# Patient Record
Sex: Female | Born: 1950 | Smoking: Current some day smoker
Health system: Southern US, Community
[De-identification: ages and names within clinical notes are randomized; demographics above are authoritative.]

---

## 2019-02-22 DIAGNOSIS — U071 COVID-19: Secondary | ICD-10-CM

## 2019-02-22 HISTORY — DX: COVID-19: U07.1

## 2019-03-09 ENCOUNTER — Other Ambulatory Visit: Payer: Self-pay

## 2019-03-09 DIAGNOSIS — Z20822 Contact with and (suspected) exposure to covid-19: Secondary | ICD-10-CM

## 2019-03-10 LAB — NOVEL CORONAVIRUS, NAA: SARS-CoV-2, NAA: DETECTED — AB

## 2019-03-14 ENCOUNTER — Telehealth: Payer: Self-pay | Admitting: Critical Care Medicine

## 2019-03-14 NOTE — Telephone Encounter (Signed)
I connected this patient had a COVID test on September 16 it was positive.  The patient was already aware of this result.  The patient's had abdominal pain cough fatigue mild shortness of breath and weakness.  She has been keeping her fever very down with ibuprofen.  She was exposed to her sister and her sister's husband who are positive.  Her symptoms began on 16 September and she drove herself to be tested.  She knew she had been exposed as well.  She understands she will have to stay in isolation till 26 September.  this is provided her fever has resolved up 3 days prior to this  The patient does not have a primary care physician and I told her if her symptoms were to worsen she should go to the emergency room she took this under advisement.  She knows the health department may be in touch with her.

## 2020-03-01 ENCOUNTER — Other Ambulatory Visit: Payer: Self-pay

## 2020-03-01 ENCOUNTER — Encounter: Payer: Self-pay | Admitting: Physician Assistant

## 2020-03-01 ENCOUNTER — Ambulatory Visit (INDEPENDENT_AMBULATORY_CARE_PROVIDER_SITE_OTHER): Payer: Medicare Other | Admitting: Physician Assistant

## 2020-03-01 VITALS — BP 126/74 | HR 85 | Ht 64.17 in | Wt 163.6 lb

## 2020-03-01 DIAGNOSIS — Z8616 Personal history of COVID-19: Secondary | ICD-10-CM

## 2020-03-01 DIAGNOSIS — R030 Elevated blood-pressure reading, without diagnosis of hypertension: Secondary | ICD-10-CM | POA: Diagnosis not present

## 2020-03-01 DIAGNOSIS — N393 Stress incontinence (female) (male): Secondary | ICD-10-CM

## 2020-03-01 DIAGNOSIS — Z7689 Persons encountering health services in other specified circumstances: Secondary | ICD-10-CM | POA: Diagnosis not present

## 2020-03-01 DIAGNOSIS — R002 Palpitations: Secondary | ICD-10-CM | POA: Diagnosis not present

## 2020-03-01 NOTE — Progress Notes (Signed)
New Patient Office Visit  Subjective:  Patient ID: Kerry Anderson, female    DOB: 02/17/1951  Age: 69 y.o. MRN: [id number]  CC:  Chief Complaint  Patient presents with  . New Patient (Initial Visit)    HPI ZULAY CORRIE presents to establish care. Pt reports she had Covid-19 infection September of 2020 and since then has been experiencing intermittent heart palpitations- fluttering sensation. Denies chest pain, shortness of breath, edema, dizziness, or lightheadedness. She hasn't been to the doctor in a long time. Denies prior history of anxiety, heart disease, hypertension, diabetes, hyperlipidemia or thyroid disorder. She doesn't take prescribed medications and takes aspirin or ibuprofen as needed for pain relief. Also has complaints of urinary leakage when coughing or exerting pressure. Family history is pertinent for hypertension and diabetes.  Past Medical History:  Diagnosis Date  . COVID-19 02/2019    History reviewed. No pertinent surgical history.  History reviewed. No pertinent family history.  Social History   Socioeconomic History  . Marital status: Unknown    Spouse name: Not on file  . Number of children: Not on file  . Years of education: Not on file  . Highest education level: Not on file  Occupational History  . Not on file  Tobacco Use  . Smoking status: Never Smoker  . Smokeless tobacco: Never Used  Substance and Sexual Activity  . Alcohol use: Yes  . Drug use: Not Currently  . Sexual activity: Not Currently  Other Topics Concern  . Not on file  Social History Narrative  . Not on file   Social Determinants of Health   Financial Resource Strain:   . Difficulty of Paying Living Expenses: Not on file  Food Insecurity:   . Worried About Programme researcher, broadcasting/film/video in the Last Year: Not on file  . Ran Out of Food in the Last Year: Not on file  Transportation Needs:   . Lack of Transportation (Medical): Not on file  . Lack of Transportation (Non-Medical):  Not on file  Physical Activity:   . Days of Exercise per Week: Not on file  . Minutes of Exercise per Session: Not on file  Stress:   . Feeling of Stress : Not on file  Social Connections:   . Frequency of Communication with Friends and Family: Not on file  . Frequency of Social Gatherings with Friends and Family: Not on file  . Attends Religious Services: Not on file  . Active Member of Clubs or Organizations: Not on file  . Attends Banker Meetings: Not on file  . Marital Status: Not on file  Intimate Partner Violence:   . Fear of Current or Ex-Partner: Not on file  . Emotionally Abused: Not on file  . Physically Abused: Not on file  . Sexually Abused: Not on file    ROS Review of Systems  Objective:   Today's Vitals: BP 126/74   Pulse 85   Ht 5' 4.17" (1.63 m)   Wt 163 lb 9.6 oz (74.2 kg)   SpO2 96%   BMI 27.93 kg/m   Physical Exam General:  Well Developed, well nourished, appropriate for stated age.  Neuro:  Alert and oriented,  extra-ocular muscles intact  HEENT:  Normocephalic, atraumatic, neck supple, no carotid bruits appreciated  Skin:  no gross rash, warm, pink. Cardiac:  RRR, S1 S2 Respiratory:  ECTA B/L and A/P, Not using accessory muscles, speaking in full sentences- unlabored. Vascular:  Ext warm, no cyanosis apprec.;  cap RF less 2 sec. Psych:  No HI/SI, judgement and insight good, Euthymic mood. Full Affect.  Assessment & Plan:   Problem List Items Addressed This Visit    None    Visit Diagnoses    Encounter to establish care    -  Primary   History of COVID-19       Palpitations       Stress incontinence       Elevated blood pressure reading in office without diagnosis of hypertension         History of Covid-19, Palpitations: -Palpitations could be related to post-covid 19. -Currently stable and asymptomatic, and denies other cardiac symptoms so plan to perform EKG with welcome to [facility].  Elevated BP reading in office without  diagnosis of hypertension: -BP initially elevated, BP recheck significantly improved and stable. -Recommend ambulatory BP and pulse monitoring.  -Follow a low sodium diet. -Will continue to monitor.  Stress incontinence: -Recommend pelvic floor exercises and reduce diuretics such as caffeine.  -If symptoms worsen or fail to improve recommend referral to Urology.  No outpatient encounter medications on file as of 03/01/2020.   No facility-administered encounter medications on file as of 03/01/2020.    Follow-up: Return in about 6 weeks (around 04/12/2020) for Welcome to [facility] and [facility] few days prior.   Mayer Masker, PA-C

## 2020-03-01 NOTE — Patient Instructions (Addendum)
Preventing Hypertension Hypertension, commonly called high blood pressure, is when the force of blood pumping through the arteries is too strong. Arteries are blood vessels that carry blood from the heart throughout the body. Over time, hypertension can damage the arteries and decrease blood flow to important parts of the body, including the brain, heart, and kidneys. Often, hypertension does not cause symptoms until blood pressure is very high. For this reason, it is important to have your blood pressure checked on a regular basis. Hypertension can often be prevented with diet and lifestyle changes. If you already have hypertension, you can control it with diet and lifestyle changes, as well as medicine. What nutrition changes can be made? Maintain a healthy diet. This includes:  Eating less salt (sodium). Ask your health care provider how much sodium is safe for you to have. The general recommendation is to consume less than 1 tsp (2,300 mg) of sodium a day. ? Do not add salt to your food. ? Choose low-sodium options when grocery shopping and eating out.  Limiting fats in your diet. You can do this by eating low-fat or fat-free dairy products and by eating less red meat.  Eating more fruits, vegetables, and whole grains. Make a goal to eat: ? 1-2 cups of fresh fruits and vegetables each day. ? 3-4 servings of whole grains each day.  Avoiding foods and beverages that have added sugars.  Eating fish that contain healthy fats (omega-3 fatty acids), such as mackerel or salmon. If you need help putting together a healthy eating plan, try the DASH diet. This diet is high in fruits, vegetables, and whole grains. It is low in sodium, red meat, and added sugars. DASH stands for Dietary Approaches to Stop Hypertension. What lifestyle changes can be made?   Lose weight if you are overweight. Losing just 3?5% of your body weight can help prevent or control hypertension. ? For example, if your present  weight is 200 lb (91 kg), a loss of 3-5% of your weight means losing 6-10 lb (2.7-4.5 kg). ? Ask your health care provider to help you with a diet and exercise plan to safely lose weight.  Get enough exercise. Do at least 150 minutes of moderate-intensity exercise each week. ? You could do this in short exercise sessions several times a day, or you could do longer exercise sessions a few times a week. For example, you could take a brisk 10-minute walk or bike ride, 3 times a day, for 5 days a week.  Find ways to reduce stress, such as exercising, meditating, listening to music, or taking a yoga class. If you need help reducing stress, ask your health care provider.  Do not smoke. This includes e-cigarettes. Chemicals in tobacco and nicotine products raise your blood pressure each time you smoke. If you need help quitting, ask your health care provider.  Avoid alcohol. If you drink alcohol, limit alcohol intake to no more than 1 drink a day for nonpregnant women and 2 drinks a day for men. One drink equals 12 oz of beer, 5 oz of wine, or 1 oz of hard liquor. Why are these changes important? Diet and lifestyle changes can help you prevent hypertension, and they may make you feel better overall and improve your quality of life. If you have hypertension, making these changes will help you control it and help prevent major complications, such as:  Hardening and narrowing of arteries that supply blood to: ? Your heart. This can cause a heart  attack. ? Your brain. This can cause a stroke. ? Your kidneys. This can cause kidney failure.  Stress on your heart muscle, which can cause heart failure. What can I do to lower my risk?  Work with your health care provider to make a hypertension prevention plan that works for you. Follow your plan and keep all follow-up visits as told by your health care provider.  Learn how to check your blood pressure at home. Make sure that you know your personal target  blood pressure, as told by your health care provider. How is this treated? In addition to diet and lifestyle changes, your health care provider may recommend medicines to help lower your blood pressure. You may need to try a few different medicines to find what works best for you. You also may need to take more than one medicine. Take over-the-counter and prescription medicines only as told by your health care provider. Where to find support Your health care provider can help you prevent hypertension and help you keep your blood pressure at a healthy level. Your local hospital or your community may also provide support services and prevention programs. The American Heart Association offers an online support network at: https://www.lee.net/ Where to find more information Learn more about hypertension from:  National Heart, Lung, and Blood Institute: https://www.peterson.org/  Centers for Disease Control and Prevention: AboutHD.co.nz  American Academy of Family Physicians: http://familydoctor.org/familydoctor/en/diseases-conditions/high-blood-pressure.printerview.all.html Learn more about the DASH diet from:  National Heart, Lung, and Blood Institute: WedMap.it Contact a health care provider if:  You think you are having a reaction to medicines you have taken.  You have recurrent headaches or feel dizzy.  You have swelling in your ankles.  You have trouble with your vision. Summary  Hypertension often does not cause any symptoms until blood pressure is very high. It is important to get your blood pressure checked regularly.  Diet and lifestyle changes are the most important steps in preventing hypertension.  By keeping your blood pressure in a healthy range, you can prevent complications like heart attack, heart failure, stroke, and kidney failure.  Work with your health care  provider to make a hypertension prevention plan that works for you. This information is not intended to replace advice given to you by your health care provider. Make sure you discuss any questions you have with your health care provider. Document Revised: 10/01/2018 Document Reviewed: 02/18/2016 Elsevier Patient Education  2020 [employer].  Palpitations Palpitations are feelings that your heartbeat is irregular or is faster than normal. It may feel like your heart is fluttering or skipping a beat. Palpitations are usually not a serious problem. They may be caused by many things, including smoking, caffeine, alcohol, stress, and certain medicines or drugs. Most causes of palpitations are not serious. However, some palpitations can be a sign of a serious problem. You may need further tests to rule out serious medical problems. Follow these instructions at home:     Pay attention to any changes in your condition. Take these actions to help manage your symptoms: Eating and drinking  Avoid foods and drinks that may cause palpitations. These may include: ? Caffeinated coffee, tea, soft drinks, diet pills, and energy drinks. ? Chocolate. ? Alcohol. Lifestyle  Take steps to reduce your stress and anxiety. Things that can help you relax include: ? Yoga. ? Mind-body activities, such as deep breathing, meditation, or using words and images to create positive thoughts (guided imagery). ? Physical activity, such as swimming, jogging, or walking.  Tell your health care provider if your palpitations increase with activity. If you have chest pain or shortness of breath with activity, do not continue the activity until you are seen by your health care provider. ? Biofeedback. This is a method that helps you learn to use your mind to control things in your body, such as your heartbeat.  Do not use drugs, including cocaine or ecstasy. Do not use marijuana.  Get plenty of rest and sleep. Keep a regular  bed time. General instructions  Take over-the-counter and prescription medicines only as told by your health care provider.  Do not use any products that contain nicotine or tobacco, such as cigarettes and e-cigarettes. If you need help quitting, ask your health care provider.  Keep all follow-up visits as told by your health care provider. This is important. These may include visits for further testing if palpitations do not go away or get worse. Contact a health care provider if you:  Continue to have a fast or irregular heartbeat after 24 hours.  Notice that your palpitations occur more often. Get help right away if you:  Have chest pain or shortness of breath.  Have a severe headache.  Feel dizzy or you faint. Summary  Palpitations are feelings that your heartbeat is irregular or is faster than normal. It may feel like your heart is fluttering or skipping a beat.  Palpitations may be caused by many things, including smoking, caffeine, alcohol, stress, certain medicines, and drugs.  Although most causes of palpitations are not serious, some causes can be a sign of a serious medical problem.  Get help right away if you faint or have chest pain, shortness of breath, a severe headache, or dizziness. This information is not intended to replace advice given to you by your health care provider. Make sure you discuss any questions you have with your health care provider. Document Revised: 07/22/2017 Document Reviewed: 07/22/2017 Elsevier Patient Education  2020 Elsevier Inc.   COVID-19 COVID-19 is a respiratory infection that is caused by a virus called severe acute respiratory syndrome coronavirus 2 (SARS-CoV-2). The disease is also known as coronavirus disease or novel coronavirus. In some people, the virus may not cause any symptoms. In others, it may cause a serious infection. The infection can get worse quickly and can lead to complications, such as:  Pneumonia, or infection of  the lungs.  Acute respiratory distress syndrome or ARDS. This is a condition in which fluid build-up in the lungs prevents the lungs from filling with air and passing oxygen into the blood.  Acute respiratory failure. This is a condition in which there is not enough oxygen passing from the lungs to the body or when carbon dioxide is not passing from the lungs out of the body.  Sepsis or septic shock. This is a serious bodily reaction to an infection.  Blood clotting problems.  Secondary infections due to bacteria or fungus.  Organ failure. This is when your body's organs stop working. The virus that causes COVID-19 is contagious. This means that it can spread from person to person through droplets from coughs and sneezes (respiratory secretions). What are the causes? This illness is caused by a virus. You may catch the virus by:  Breathing in droplets from an infected person. Droplets can be spread by a person breathing, speaking, singing, coughing, or sneezing.  Touching something, like a table or a doorknob, that was exposed to the virus (contaminated) and then touching your mouth, nose, or eyes.  What increases the risk? Risk for infection You are more likely to be infected with this virus if you:  Are within 6 feet (2 meters) of a person with COVID-19.  Provide care for or live with a person who is infected with COVID-19.  Spend time in crowded indoor spaces or live in shared housing. Risk for serious illness You are more likely to become seriously ill from the virus if you:  Are 57 years of age or older. The higher your age, the more you are at risk for serious illness.  Live in a nursing home or long-term care facility.  Have cancer.  Have a long-term (chronic) disease such as: ? Chronic lung disease, including chronic obstructive pulmonary disease or asthma. ? A long-term disease that lowers your body's ability to fight infection (immunocompromised). ? Heart disease,  including heart failure, a condition in which the arteries that lead to the heart become narrow or blocked (coronary artery disease), a disease which makes the heart muscle thick, weak, or stiff (cardiomyopathy). ? Diabetes. ? Chronic kidney disease. ? Sickle cell disease, a condition in which red blood cells have an abnormal "sickle" shape. ? Liver disease.  Are obese. What are the signs or symptoms? Symptoms of this condition can range from mild to severe. Symptoms may appear any time from 2 to 14 days after being exposed to the virus. They include:  A fever or chills.  A cough.  Difficulty breathing.  Headaches, body aches, or muscle aches.  Runny or stuffy (congested) nose.  A sore throat.  New loss of taste or smell. Some people may also have stomach problems, such as nausea, vomiting, or diarrhea. Other people may not have any symptoms of COVID-19. How is this diagnosed? This condition may be diagnosed based on:  Your signs and symptoms, especially if: ? You live in an area with a COVID-19 outbreak. ? You recently traveled to or from an area where the virus is common. ? You provide care for or live with a person who was diagnosed with COVID-19. ? You were exposed to a person who was diagnosed with COVID-19.  A physical exam.  Lab tests, which may include: ? Taking a sample of fluid from the back of your nose and throat (nasopharyngeal fluid), your nose, or your throat using a swab. ? A sample of mucus from your lungs (sputum). ? Blood tests.  Imaging tests, which may include, X-rays, CT scan, or ultrasound. How is this treated? At present, there is no medicine to treat COVID-19. Medicines that treat other diseases are being used on a trial basis to see if they are effective against COVID-19. Your health care provider will talk with you about ways to treat your symptoms. For most people, the infection is mild and can be managed at home with rest, fluids, and  over-the-counter medicines. Treatment for a serious infection usually takes places in a hospital intensive care unit (ICU). It may include one or more of the following treatments. These treatments are given until your symptoms improve.  Receiving fluids and medicines through an IV.  Supplemental oxygen. Extra oxygen is given through a tube in the nose, a face mask, or a hood.  Positioning you to lie on your stomach (prone position). This makes it easier for oxygen to get into the lungs.  Continuous positive airway pressure (CPAP) or bi-level positive airway pressure (BPAP) machine. This treatment uses mild air pressure to keep the airways open. A tube that is connected to  a motor delivers oxygen to the body.  Ventilator. This treatment moves air into and out of the lungs by using a tube that is placed in your windpipe.  Tracheostomy. This is a procedure to create a hole in the neck so that a breathing tube can be inserted.  Extracorporeal membrane oxygenation (ECMO). This procedure gives the lungs a chance to recover by taking over the functions of the heart and lungs. It supplies oxygen to the body and removes carbon dioxide. Follow these instructions at home: Lifestyle  If you are sick, stay home except to get medical care. Your health care provider will tell you how long to stay home. Call your health care provider before you go for medical care.  Rest at home as told by your health care provider.  Do not use any products that contain nicotine or tobacco, such as cigarettes, e-cigarettes, and chewing tobacco. If you need help quitting, ask your health care provider.  Return to your normal activities as told by your health care provider. Ask your health care provider what activities are safe for you. General instructions  Take over-the-counter and prescription medicines only as told by your health care provider.  Drink enough fluid to keep your urine pale yellow.  Keep all follow-up  visits as told by your health care provider. This is important. How is this prevented?  There is no vaccine to help prevent COVID-19 infection. However, there are steps you can take to protect yourself and others from this virus. To protect yourself:   Do not travel to areas where COVID-19 is a risk. The areas where COVID-19 is reported change often. To identify high-risk areas and travel restrictions, check the CDC travel website: StageSync.si  If you live in, or must travel to, an area where COVID-19 is a risk, take precautions to avoid infection. ? Stay away from people who are sick. ? Wash your hands often with soap and water for 20 seconds. If soap and water are not available, use an alcohol-based hand sanitizer. ? Avoid touching your mouth, face, eyes, or nose. ? Avoid going out in public, follow guidance from your state and local health authorities. ? If you must go out in public, wear a cloth face covering or face mask. Make sure your mask covers your nose and mouth. ? Avoid crowded indoor spaces. Stay at least 6 feet (2 meters) away from others. ? Disinfect objects and surfaces that are frequently touched every day. This may include:  Counters and tables.  Doorknobs and light switches.  Sinks and faucets.  Electronics, such as phones, remote controls, keyboards, computers, and tablets. To protect others: If you have symptoms of COVID-19, take steps to prevent the virus from spreading to others.  If you think you have a COVID-19 infection, contact your health care provider right away. Tell your health care team that you think you may have a COVID-19 infection.  Stay home. Leave your house only to seek medical care. Do not use public transport.  Do not travel while you are sick.  Wash your hands often with soap and water for 20 seconds. If soap and water are not available, use alcohol-based hand sanitizer.  Stay away from other members of your household. Let  healthy household members care for children and pets, if possible. If you have to care for children or pets, wash your hands often and wear a mask. If possible, stay in your own room, separate from others. Use a different bathroom.  Make  sure that all people in your household wash their hands well and often.  Cough or sneeze into a tissue or your sleeve or elbow. Do not cough or sneeze into your hand or into the air.  Wear a cloth face covering or face mask. Make sure your mask covers your nose and mouth. Where to find more information  Centers for Disease Control and Prevention: StickerEmporium.tnwww.cdc.gov/coronavirus/2019-ncov/index.html  World Health Organization: https://thompson-craig.com/www.who.int/health-topics/coronavirus Contact a health care provider if:  You live in or have traveled to an area where COVID-19 is a risk and you have symptoms of the infection.  You have had contact with someone who has COVID-19 and you have symptoms of the infection. Get help right away if:  You have trouble breathing.  You have pain or pressure in your chest.  You have confusion.  You have bluish lips and fingernails.  You have difficulty waking from sleep.  You have symptoms that get worse. These symptoms may represent a serious problem that is an emergency. Do not wait to see if the symptoms will go away. Get medical help right away. Call your local emergency services (911 in the [place] Do not drive yourself to the hospital. Let the emergency medical personnel know if you think you have COVID-19. Summary  COVID-19 is a respiratory infection that is caused by a virus. It is also known as coronavirus disease or novel coronavirus. It can cause serious infections, such as pneumonia, acute respiratory distress syndrome, acute respiratory failure, or sepsis.  The virus that causes COVID-19 is contagious. This means that it can spread from person to person through droplets from breathing, speaking, singing, coughing, or  sneezing.  You are more likely to develop a serious illness if you are 850 years of age or older, have a weak immune system, live in a nursing home, or have chronic disease.  There is no medicine to treat COVID-19. Your health care provider will talk with you about ways to treat your symptoms.  Take steps to protect yourself and others from infection. Wash your hands often and disinfect objects and surfaces that are frequently touched every day. Stay away from people who are sick and wear a mask if you are sick. This information is not intended to replace advice given to you by your health care provider. Make sure you discuss any questions you have with your health care provider. Document Revised: 04/08/2019 Document Reviewed: 07/15/2018 Elsevier Patient Education  2020 [employer]

## 2020-04-20 ENCOUNTER — Other Ambulatory Visit: Payer: Self-pay | Admitting: Physician Assistant

## 2020-04-20 DIAGNOSIS — Z0000 Encounter for general adult medical examination without abnormal findings: Secondary | ICD-10-CM

## 2020-04-23 ENCOUNTER — Other Ambulatory Visit: Payer: Self-pay

## 2020-04-23 ENCOUNTER — Other Ambulatory Visit: Payer: Medicare Other

## 2020-04-23 DIAGNOSIS — Z0000 Encounter for general adult medical examination without abnormal findings: Secondary | ICD-10-CM | POA: Diagnosis not present

## 2020-04-23 NOTE — Addendum Note (Signed)
Addended by: Sylvester Harder on: 04/23/2020 09:06 AM   Modules accepted: Orders

## 2020-04-23 NOTE — Addendum Note (Signed)
Addended by: Sylvester Harder on: 04/23/2020 09:11 AM   Modules accepted: Orders

## 2020-04-24 LAB — COMPREHENSIVE METABOLIC PANEL
ALT: 19 IU/L (ref 0–32)
AST: 21 IU/L (ref 0–40)
Albumin/Globulin Ratio: 1.4 (ref 1.2–2.2)
Albumin: 4.1 g/dL (ref 3.8–4.8)
Alkaline Phosphatase: 103 IU/L (ref 44–121)
BUN/Creatinine Ratio: 24 (ref 12–28)
BUN: 21 mg/dL (ref 8–27)
Bilirubin Total: <0.2 mg/dL (ref 0.0–1.2)
CO2: 23 mmol/L (ref 20–29)
Calcium: 9.5 mg/dL (ref 8.7–10.3)
Chloride: 105 mmol/L (ref 96–106)
Creatinine, Ser: 0.87 mg/dL (ref 0.57–1.00)
GFR calc Af Amer: 78 mL/min/{1.73_m2} (ref >59)
GFR calc non Af Amer: 68 mL/min/{1.73_m2} (ref >59)
Globulin, Total: 2.9 g/dL (ref 1.5–4.5)
Glucose: 106 mg/dL — ABNORMAL HIGH (ref 65–99)
Potassium: 4.3 mmol/L (ref 3.5–5.2)
Sodium: 141 mmol/L (ref 134–144)
Total Protein: 7 g/dL (ref 6.0–8.5)

## 2020-04-24 LAB — CBC
Hematocrit: 47.1 % — ABNORMAL HIGH (ref 34.0–46.6)
Hemoglobin: 15.7 g/dL (ref 11.1–15.9)
MCH: 28.8 pg (ref 26.6–33.0)
MCHC: 33.3 g/dL (ref 31.5–35.7)
MCV: 86 fL (ref 79–97)
Platelets: 306 10*3/uL (ref 150–450)
RBC: 5.46 x10E6/uL — ABNORMAL HIGH (ref 3.77–5.28)
RDW: 13.4 % (ref 11.7–15.4)
WBC: 8.1 10*3/uL (ref 3.4–10.8)

## 2020-04-24 LAB — LIPID PANEL
Chol/HDL Ratio: 4.1 ratio (ref 0.0–4.4)
Cholesterol, Total: 235 mg/dL — ABNORMAL HIGH (ref 100–199)
HDL: 57 mg/dL (ref >39)
LDL Chol Calc (NIH): 164 mg/dL — ABNORMAL HIGH (ref 0–99)
Triglycerides: 79 mg/dL (ref 0–149)
VLDL Cholesterol Cal: 14 mg/dL (ref 5–40)

## 2020-04-24 LAB — LYME AB/WESTERN BLOT REFLEX
LYME DISEASE AB, QUANT, IGM: <0.8 index (ref 0.00–0.79)
Lyme IgG/IgM Ab: <0.91 {ISR} (ref 0.00–0.90)

## 2020-04-24 LAB — HEMOGLOBIN A1C
Est. average glucose Bld gHb Est-mCnc: 117 mg/dL
Hgb A1c MFr Bld: 5.7 % — ABNORMAL HIGH (ref 4.8–5.6)

## 2020-04-24 LAB — TSH: TSH: 2.27 u[IU]/mL (ref 0.450–4.500)

## 2020-04-26 ENCOUNTER — Other Ambulatory Visit: Payer: Self-pay

## 2020-04-26 ENCOUNTER — Ambulatory Visit (INDEPENDENT_AMBULATORY_CARE_PROVIDER_SITE_OTHER): Payer: Medicare Other | Admitting: Physician Assistant

## 2020-04-26 VITALS — Ht 64.0 in | Wt 162.0 lb

## 2020-04-26 DIAGNOSIS — Z1231 Encounter for screening mammogram for malignant neoplasm of breast: Secondary | ICD-10-CM | POA: Diagnosis not present

## 2020-04-26 DIAGNOSIS — E785 Hyperlipidemia, unspecified: Secondary | ICD-10-CM

## 2020-04-26 DIAGNOSIS — Z1211 Encounter for screening for malignant neoplasm of colon: Secondary | ICD-10-CM

## 2020-04-26 DIAGNOSIS — Z1159 Encounter for screening for other viral diseases: Secondary | ICD-10-CM | POA: Diagnosis not present

## 2020-04-26 DIAGNOSIS — Z0000 Encounter for general adult medical examination without abnormal findings: Secondary | ICD-10-CM

## 2020-04-26 DIAGNOSIS — Z780 Asymptomatic menopausal state: Secondary | ICD-10-CM

## 2020-04-26 DIAGNOSIS — R7303 Prediabetes: Secondary | ICD-10-CM

## 2020-04-26 NOTE — Progress Notes (Signed)
Virtual Visit via Telephone Note:  I connected with Kerry Anderson by telephone and verified that I am speaking with the correct person using two identifiers.    I discussed the limitations, risks, security and privacy concerns for performing an evaluation and management service by telephone and the availability of in person appointments. The staff discussed with patient that there may be a patient responsible charge related to this service. The patient expressed understanding and agreed to proceed.   Location of Patient- Home Location of Provider- Office    Subjective:   Kerry Anderson is a 69 y.o. female who presents for Medicare Annual (Subsequent) preventive examination.  Review of Systems    General:   No F/C, wt loss Pulm:   No DIB, SOB, pleuritic chest pain Card:  No CP, palpitations Abd:  No n/v/d or pain Ext:  No inc edema from baseline   Objective:    Today's Vitals   04/26/20 1150  Weight: 162 lb (73.5 kg)  Height: 5\' 4"  (1.626 m)   Body mass index is 27.81 kg/m.  No flowsheet data found.  Current Medications (verified) No outpatient encounter medications on file as of 04/26/2020.   No facility-administered encounter medications on file as of 04/26/2020.    Allergies (verified) Patient has no allergy information on record.   History: Past Medical History:  Diagnosis Date   COVID-19 02/2019   No past surgical history on file. No family history on file. Social History   Socioeconomic History   Marital status: Unknown    Spouse name: Not on file   Number of children: Not on file   Years of education: Not on file   Highest education level: Not on file  Occupational History   Not on file  Tobacco Use   Smoking status: Never Smoker   Smokeless tobacco: Never Used  Substance and Sexual Activity   Alcohol use: Yes   Drug use: Not Currently   Sexual activity: Not Currently  Other Topics Concern   Not on file  Social History Narrative    Not on file   Social Determinants of Health   Financial Resource Strain:    Difficulty of Paying Living Expenses: Not on file  Food Insecurity:    Worried About Running Out of Food in the Last Year: Not on file   Ran Out of Food in the Last Year: Not on file  Transportation Needs:    Lack of Transportation (Medical): Not on file   Lack of Transportation (Non-Medical): Not on file  Physical Activity:    Days of Exercise per Week: Not on file   Minutes of Exercise per Session: Not on file  Stress:    Feeling of Stress : Not on file  Social Connections:    Frequency of Communication with Friends and Family: Not on file   Frequency of Social Gatherings with Friends and Family: Not on file   Attends Religious Services: Not on file   Active Member of Clubs or Organizations: Not on file   Attends 03/2019 Meetings: Not on file   Marital Status: Not on file    Tobacco Counseling Counseling given: Not Answered    Diabetic? No    Activities of Daily Living In your present state of health, do you have any difficulty performing the following activities: 04/26/2020  Hearing? N  Vision? Y  Difficulty concentrating or making decisions? N  Walking or climbing stairs? N  Dressing or bathing? N  Doing errands,  shopping? N  Some recent data might be hidden    Patient Care Team: Mayer Masker, PA-C as PCP - General (Physician Assistant)  Indicate any recent Medical Services you may have received from other than [facility] providers in the past year (date may be approximate).     Assessment:   This is a routine wellness examination for Kerry Anderson.  Hearing/Vision screen No exam data present  Dietary issues and exercise activities discussed: -Follow a heart healthy diet and reduce saturated and trans fats. Stay as active as possible.   Goals   None    Depression Screen PHQ 2/9 Scores 04/26/2020  PHQ - 2 Score 2  PHQ- 9 Score 7    Fall Risk Fall Risk   04/26/2020  Falls in the past year? 0  Risk for fall due to : No Fall Risks  Follow up Falls evaluation completed    Any stairs in or around the home? Yes  If so, are there any without handrails? Yes  Home free of loose throw rugs in walkways, pet beds, electrical cords, etc? No  Adequate lighting in your home to reduce risk of falls? Yes   ASSISTIVE DEVICES UTILIZED TO PREVENT FALLS:  Life alert? No  Use of a cane, walker or w/c? No  Grab bars in the bathroom? No  Shower chair or bench in shower? No  Elevated toilet seat or a handicapped toilet? No   TIMED UP AND GO:  Was the test performed? No . Telehealth visit   Cognitive Function: wnl     6CIT Screen 04/26/2020  What Year? 0 points  What month? 0 points  What time? 0 points  Count back from 20 0 points  Months in reverse 0 points  Repeat phrase 0 points  Total Score 0    Immunizations Immunization History  Administered Date(s) Administered   Influenza-Unspecified 04/25/2020   PFIZER SARS-COV-2 Vaccination 08/16/2019, 09/06/2019   Pneumococcal Polysaccharide-23 01/26/2019    TDAP status: Due, Education has been provided regarding the importance of this vaccine. Advised may receive this vaccine at local pharmacy or Health Dept. Aware to provide a copy of the vaccination record if obtained from local pharmacy or Health Dept. Verbalized acceptance and understanding. Flu Vaccine status: Up to date Pneumococcal vaccine status: Declined,  Education has been provided regarding the importance of this vaccine but patient still declined. Advised may receive this vaccine at local pharmacy or Health Dept. Aware to provide a copy of the vaccination record if obtained from local pharmacy or Health Dept. Verbalized acceptance and understanding.  Covid-19 vaccine status: Completed vaccines  Qualifies for Shingles Vaccine? Yes   Zostavax completed No   Shingrix Completed?: No.    Education has been provided regarding the  importance of this vaccine. Patient has been advised to call insurance company to determine out of pocket expense if they have not yet received this vaccine. Advised may also receive vaccine at local pharmacy or Health Dept. Verbalized acceptance and understanding.  Screening Tests Health Maintenance  Topic Date Due   Hepatitis C Screening  Never done   TETANUS/TDAP  Never done   MAMMOGRAM  Never done   COLONOSCOPY  Never done   DEXA SCAN  Never done   PNA vac Low Risk Adult (2 of 2 - PCV13) 01/26/2020   INFLUENZA VACCINE  Completed   COVID-19 Vaccine  Completed    Health Maintenance  Health Maintenance Due  Topic Date Due   Hepatitis C Screening  Never done  TETANUS/TDAP  Never done   MAMMOGRAM  Never done   COLONOSCOPY  Never done   DEXA SCAN  Never done   PNA vac Low Risk Adult (2 of 2 - PCV13) 01/26/2020    Colorectal cancer screening: Referral to GI placed .Marland Kitchen Pt aware the office will call re: appt. Mammogram status: Ordered today. Pt provided with contact info and advised to call to schedule appt.  Bone Density status: Ordered today. Pt provided with contact info and advised to call to schedule appt.  Lung Cancer Screening: (Low Dose CT Chest recommended if Age 48-80 years, 30 pack-year currently smoking OR have quit w/in 15years.) does not qualify.     Additional Screening:  Hepatitis C Screening: does qualify; ordered today  Vision Screening: Recommended annual ophthalmology exams for early detection of glaucoma and other disorders of the eye. Is the patient up to date with their annual eye exam?  No  Who is the provider or what is the name of the office in which the patient attends annual eye exams? Dr. Drema Halon .   Dental Screening: Recommended annual dental exams for proper oral hygiene  Community Resource Referral / Chronic Care Management: CRR required this visit?  No   CCM required this visit?  No      Plan:  -Follow up in 4-5  months for hyperlipidemia and prediabetes and repeat lipid panel, cbc.  -Discussed most recent lab results. Advised to monitor carbohydrates and glucose, and reduce dairy/red meats/fried foods.  -Placed orders for screening colonoscopy, mammogram, bone density and Hep C screening.  I have personally reviewed and noted the following in the patients chart:    Medical and social history  Use of alcohol, tobacco or illicit drugs   Current medications and supplements  Functional ability and status  Nutritional status  Physical activity  Advanced directives  List of other physicians  Hospitalizations, surgeries, and ER visits in previous 12 months  Vitals  Screenings to include cognitive, depression, and falls  Referrals and appointments  In addition, I have reviewed and discussed with patient certain preventive protocols, quality metrics, and best practice recommendations. A written personalized care plan for preventive services as well as general preventive health recommendations were provided to patient.        04/26/2020

## 2020-09-17 ENCOUNTER — Other Ambulatory Visit: Payer: Self-pay | Admitting: Physician Assistant

## 2020-09-17 DIAGNOSIS — E785 Hyperlipidemia, unspecified: Secondary | ICD-10-CM

## 2020-09-17 DIAGNOSIS — R7303 Prediabetes: Secondary | ICD-10-CM

## 2020-09-21 ENCOUNTER — Other Ambulatory Visit: Payer: Medicare Other

## 2020-09-24 ENCOUNTER — Ambulatory Visit: Payer: Medicare Other | Admitting: Physician Assistant

## 2020-11-14 ENCOUNTER — Telehealth: Payer: Self-pay | Admitting: Physician Assistant

## 2020-11-14 NOTE — Telephone Encounter (Signed)
Left msg for patient to call office back.   Pt due for Mammogram. Going to offer apt for June 8th when bus will be at our office. AS, CMA

## 2021-02-05 ENCOUNTER — Ambulatory Visit (INDEPENDENT_AMBULATORY_CARE_PROVIDER_SITE_OTHER): Payer: Medicare Other | Admitting: Nurse Practitioner

## 2021-02-05 ENCOUNTER — Telehealth: Payer: Self-pay | Admitting: Physician Assistant

## 2021-02-05 ENCOUNTER — Encounter: Payer: Self-pay | Admitting: Nurse Practitioner

## 2021-02-05 ENCOUNTER — Other Ambulatory Visit: Payer: Self-pay

## 2021-02-05 VITALS — Ht 66.0 in | Wt 162.0 lb

## 2021-02-05 DIAGNOSIS — U071 COVID-19: Secondary | ICD-10-CM | POA: Diagnosis not present

## 2021-02-05 DIAGNOSIS — J069 Acute upper respiratory infection, unspecified: Secondary | ICD-10-CM

## 2021-02-05 MED ORDER — NIRMATRELVIR/RITONAVIR (PAXLOVID)TABLET: 3.0000 | ORAL_TABLET | Freq: Two times a day (BID) | ORAL | 0 refills | Status: AC

## 2021-02-05 NOTE — Telephone Encounter (Signed)
Patient left message on voicemail.  Patient has COVID again and would like Paxlovid called into [facility] pharmacy on [address].  If unable to do so please call patient at [phone].

## 2021-02-05 NOTE — Telephone Encounter (Signed)
Attempted to contact patient but phone line is busy.  Please call patient and add to [facility] afternoon schedule for a telehealth apt to discuss treatment of covid. AS, CMA

## 2021-02-05 NOTE — Progress Notes (Signed)
Virtual Visit via Telephone Note  I connected with Kerry Anderson on 02/05/21 at  3:50 PM EDT by telephone and verified that I am speaking with the correct person using two identifiers.  Location: Patient: home Provider: [place] [facility]     I discussed the limitations, risks, security and privacy concerns of performing an evaluation and management service by telephone and the availability of in person appointments. I also discussed with the patient that there may be a patient responsible charge related to this service. The patient expressed understanding and agreed to proceed.   History of Present Illness: This patient presents with 5 days of congestion, chills, fever, and cough.  She denies headache or body aches.  She denies nausea, vomiting, or diarrhea.  States that she was around friends over the weekend who are also feeling similar symptoms.  All have since tested positive for COVID-19.  She has been taking over-the-counter Tylenol and ibuprofen.  These have helped symptoms some.  Continues to feel very tired.  She does have a history of elevated BMI and prediabetes.   Observations/Objective:  The patient is alert and oriented. She is pleasant and answers all questions appropriately. Breathing is non-labored. She is in no acute distress at this time.  The patient does sound nasally congested.  She has a mild, nonproductive cough that can be heard during today's visit.  Today's Vitals   02/05/21 1616  Weight: 162 lb (73.5 kg)  Height: 5\' 6"  (1.676 m)   Body mass index is 26.15 kg/m.   Assessment and Plan: 1. Upper respiratory tract infection due to COVID-19 virus Patient positive for COVID-19 earlier this week.  We will start Paxlovid.  She will take 3 tablets twice daily for next 5 days.  Most recent GFR 68.  Rest and increase fluids. Continue using OTC medication to control symptoms.  She should continue to follow quarantine and isolation guidelines per CDC.   She voiced understanding and agreement with the plan. - nirmatrelvir/ritonavir EUA (PAXLOVID) TABS; Take 3 tablets by mouth 2 (two) times daily for 5 days. (Take nirmatrelvir 150 mg two tablets twice daily for 5 days and ritonavir 100 mg one tablet twice daily for 5 days) Patient GFR is 68  Dispense: 30 tablet; Refill: 0   Follow Up Instructions:    I discussed the assessment and treatment plan with the patient. The patient was provided an opportunity to ask questions and all were answered. The patient agreed with the plan and demonstrated an understanding of the instructions.   The patient was advised to call back or seek an in-person evaluation if the symptoms worsen or if the condition fails to improve as anticipated.  I provided 15 minutes of non-face-to-face time during this encounter.  This note was dictated using . Rapid proofreading was performed to expedite the delivery of the information. Despite proofreading, phonetic errors will occur which are common with this voice recognition software. Please take this into consideration. If there are any concerns, please contact our office.    Conservation officer, historic buildings, NP

## 2021-02-10 DIAGNOSIS — U071 COVID-19: Secondary | ICD-10-CM | POA: Insufficient documentation

## 2021-02-10 DIAGNOSIS — J069 Acute upper respiratory infection, unspecified: Secondary | ICD-10-CM | POA: Insufficient documentation

## 2021-12-03 ENCOUNTER — Other Ambulatory Visit: Payer: Self-pay | Admitting: Physician Assistant

## 2021-12-03 ENCOUNTER — Ambulatory Visit
Admission: RE | Admit: 2021-12-03 | Discharge: 2021-12-03 | Disposition: A | Discharge status: Home / Self Care | Payer: Medicare Other | Source: Ambulatory Visit | Attending: Physician Assistant | Admitting: Physician Assistant

## 2021-12-03 DIAGNOSIS — Z1231 Encounter for screening mammogram for malignant neoplasm of breast: Secondary | ICD-10-CM

## 2021-12-12 ENCOUNTER — Encounter: Payer: Medicare Other | Admitting: Physician Assistant

## 2021-12-17 ENCOUNTER — Encounter: Payer: Medicare Other | Admitting: Physician Assistant

## 2021-12-31 ENCOUNTER — Ambulatory Visit (INDEPENDENT_AMBULATORY_CARE_PROVIDER_SITE_OTHER): Payer: Medicare Other | Admitting: Physician Assistant

## 2021-12-31 ENCOUNTER — Encounter: Payer: Self-pay | Admitting: Physician Assistant

## 2021-12-31 VITALS — BP 148/72 | HR 76 | Temp 97.7°F | Ht 66.0 in | Wt 159.0 lb

## 2021-12-31 DIAGNOSIS — Z1211 Encounter for screening for malignant neoplasm of colon: Secondary | ICD-10-CM

## 2021-12-31 DIAGNOSIS — E2839 Other primary ovarian failure: Secondary | ICD-10-CM | POA: Diagnosis not present

## 2021-12-31 DIAGNOSIS — E785 Hyperlipidemia, unspecified: Secondary | ICD-10-CM

## 2021-12-31 DIAGNOSIS — Z780 Asymptomatic menopausal state: Secondary | ICD-10-CM | POA: Diagnosis not present

## 2021-12-31 DIAGNOSIS — R7303 Prediabetes: Secondary | ICD-10-CM | POA: Diagnosis not present

## 2021-12-31 DIAGNOSIS — Z0000 Encounter for general adult medical examination without abnormal findings: Secondary | ICD-10-CM | POA: Diagnosis not present

## 2021-12-31 NOTE — Progress Notes (Signed)
Subjective:   Kerry Anderson is a 71 y.o. female who presents for Medicare Annual (Subsequent) preventive examination.  Review of Systems    Refer to PCP  I connected with  Kerry Anderson on 12/31/21 by IN PERSON verified that I am speaking with the correct person using two identifiers.   I discussed the limitations, risks, security and privacy concerns of performing an evaluation and management service by telephone and the availability of in person appointments. I also discussed with the patient that there may be a patient responsible charge related to this service. The patient expressed understanding and verbally consented to this telephonic visit.  Location of Patient: [employer], [employer] of Provider: Office  List any persons and their role that are participating in the visit with the patient.  Willard Madrigal, CMA     Objective:    Today's Vitals   12/31/21 1338  Weight: 159 lb (72.1 kg)  Height: 5\' 6"  (1.676 m)   Body mass index is 25.66 kg/m.      No data to display           Current Medications (verified) No outpatient encounter medications on file as of 12/31/2021.   No facility-administered encounter medications on file as of 12/31/2021.    Allergies (verified) Patient has no allergy information on record.   History: Past Medical History:  Diagnosis Date   COVID-19 02/2019   No past surgical history on file. No family history on file. Social History   Socioeconomic History   Marital status: Unknown    Spouse name: Not on file   Number of children: Not on file   Years of education: Not on file   Highest education level: Not on file  Occupational History   Not on file  Tobacco Use   Smoking status: Never   Smokeless tobacco: Never  Substance and Sexual Activity   Alcohol use: Yes   Drug use: Not Currently   Sexual activity: Not Currently  Other Topics Concern   Not on file  Social History Narrative   Not on file   Social Determinants of  Health   Financial Resource Strain: Not on file  Food Insecurity: Not on file  Transportation Needs: Not on file  Physical Activity: Not on file  Stress: Not on file  Social Connections: Not on file    Tobacco Counseling Counseling given: Not Answered   Clinical Intake:                 Diabetic?No         Activities of Daily Living     No data to display           Patient Care Team: 03/2019, PA-C as PCP - General (Physician Assistant)  Indicate any recent Medical Services you may have received from other than [facility] providers in the past year (date may be approximate).     Assessment:   This is a routine wellness examination for Kerry Anderson.  Hearing/Vision screen No results found.  Dietary issues and exercise activities discussed:     Goals Addressed   None   Depression Screen    04/26/2020   11:55 AM  PHQ 2/9 Scores  PHQ - 2 Score 2  PHQ- 9 Score 7    Fall Risk    04/26/2020   11:52 AM  Fall Risk   Falls in the past year? 0  Risk for fall due to : No Fall Risks  Follow up Falls evaluation  completed    FALL RISK PREVENTION PERTAINING TO THE HOME:  Any stairs in or around the home? Yes  If so, are there any without handrails? No  Home free of loose throw rugs in walkways, pet beds, electrical cords, etc? Yes  Adequate lighting in your home to reduce risk of falls? Yes   ASSISTIVE DEVICES UTILIZED TO PREVENT FALLS:  Life alert? No  Use of a cane, walker or w/c? No  Grab bars in the bathroom? No  Shower chair or bench in shower? No  Elevated toilet seat or a handicapped toilet? No   TIMED UP AND GO:  Was the test performed? Yes .  Length of time to ambulate 10 feet: 8 sec.   Gait steady and fast without use of assistive device  Cognitive Function:        04/26/2020   11:58 AM  6CIT Screen  What Year? 0 points  What month? 0 points  What time? 0 points  Count back from 20 0 points  Months in reverse 0 points   Repeat phrase 0 points  Total Score 0 points    Immunizations Immunization History  Administered Date(s) Administered   Influenza-Unspecified 04/25/2020   PFIZER(Purple Top)SARS-COV-2 Vaccination 08/16/2019, 09/06/2019   Pneumococcal Polysaccharide-23 01/26/2019    TDAP status: Due, Education has been provided regarding the importance of this vaccine. Advised may receive this vaccine at local pharmacy or Health Dept. Aware to provide a copy of the vaccination record if obtained from local pharmacy or Health Dept. Verbalized acceptance and understanding.  Flu Vaccine status: Up to date  Pneumococcal vaccine status: Due, Education has been provided regarding the importance of this vaccine. Advised may receive this vaccine at local pharmacy or Health Dept. Aware to provide a copy of the vaccination record if obtained from local pharmacy or Health Dept. Verbalized acceptance and understanding.  Covid-19 vaccine status: Completed vaccines  Qualifies for Shingles Vaccine? Yes   Zostavax completed No   Shingrix Completed?: No.    Education has been provided regarding the importance of this vaccine. Patient has been advised to call insurance company to determine out of pocket expense if they have not yet received this vaccine. Advised may also receive vaccine at local pharmacy or Health Dept. Verbalized acceptance and understanding.  Screening Tests Health Maintenance  Topic Date Due   Hepatitis C Screening  Never done   TETANUS/TDAP  Never done   COLONOSCOPY (Pts 45-49yrs Insurance coverage will need to be confirmed)  Never done   Zoster Vaccines- Shingrix (1 of 2) Never done   DEXA SCAN  Never done   COVID-19 Vaccine (3 - Pfizer series) 11/01/2019   Pneumonia Vaccine 73+ Years old (2 - PCV) 01/26/2020   INFLUENZA VACCINE  01/21/2022   MAMMOGRAM  12/04/2023   HPV VACCINES  Aged Out    Health Maintenance  Health Maintenance Due  Topic Date Due   Hepatitis C Screening  Never done    TETANUS/TDAP  Never done   COLONOSCOPY (Pts 45-25yrs Insurance coverage will need to be confirmed)  Never done   Zoster Vaccines- Shingrix (1 of 2) Never done   DEXA SCAN  Never done   COVID-19 Vaccine (3 - Pfizer series) 11/01/2019   Pneumonia Vaccine 27+ Years old (2 - PCV) 01/26/2020    Colorectal cancer screening: Type of screening: Cologuard. Completed ORDERED TODAY. Repeat every   years  Mammogram status: Completed 2023. Repeat every year  Bone Density status: Ordered Today. Pt provided with  contact info and advised to call to schedule appt.  Lung Cancer Screening: (Low Dose CT Chest recommended if Age 21-80 years, 30 pack-year currently smoking OR have quit w/in 15years.) does not qualify.   Lung Cancer Screening Referral:   Additional Screening:  Hepatitis C Screening: Declined  Vision Screening: Recommended annual ophthalmology exams for early detection of glaucoma and other disorders of the eye. Is the patient up to date with their annual eye exam?  Yes  Who is the provider or what is the name of the office in which the patient attends annual eye exams? Dr. Senaida Ores If pt is not established with a provider, would they like to be referred to a provider to establish care? No .   Dental Screening: Recommended annual dental exams for proper oral hygiene  Community Resource Referral / Chronic Care Management: CRR required this visit?  No   CCM required this visit?  No      Plan:     I have personally reviewed and noted the following in the patient's chart:   Medical and social history Use of alcohol, tobacco or illicit drugs  Current medications and supplements including opioid prescriptions.  Functional ability and status Nutritional status Physical activity Advanced directives List of other physicians Hospitalizations, surgeries, and ER visits in previous 12 months Vitals Screenings to include cognitive, depression, and falls Referrals and  appointments  In addition, I have reviewed and discussed with patient certain preventive protocols, quality metrics, and best practice recommendations. A written personalized care plan for preventive services as well as general preventive health recommendations were provided to patient.     Kerry Anderson, CMA   12/31/2021   Nurse Notes: Face to Face 20 minutes    Kerry Anderson , Thank you for taking time to come for your Medicare Wellness Visit. I appreciate your ongoing commitment to your health goals. Please review the following plan we discussed and let me know if I can assist you in the future.   These are the goals we discussed:  Goals   None   TDAP Vaccine Shingles Vaccine Pneumonia Vaccine Cologuard Screening Dexa Scan This is a list of the screening recommended for you and due dates:  Health Maintenance  Topic Date Due   Hepatitis C Screening: USPSTF Recommendation to screen - Ages 20-79 yo.  Never done   Tetanus Vaccine  Never done   Colon Cancer Screening  Never done   Zoster (Shingles) Vaccine (1 of 2) Never done   DEXA scan (bone density measurement)  Never done   COVID-19 Vaccine (3 - Pfizer series) 11/01/2019   Pneumonia Vaccine (2 - PCV) 01/26/2020   Flu Shot  01/21/2022   Mammogram  12/04/2023   HPV Vaccine  Aged Out

## 2021-12-31 NOTE — Patient Instructions (Signed)
Preventive Care 65 Years and Older, Female Preventive care refers to lifestyle choices and visits with your health care provider that can promote health and wellness. Preventive care visits are also called wellness exams. What can I expect for my preventive care visit? Counseling Your health care provider may ask you questions about your: Medical history, including: Past medical problems. Family medical history. Pregnancy and menstrual history. History of falls. Current health, including: Memory and ability to understand (cognition). Emotional well-being. Home life and relationship well-being. Sexual activity and sexual health. Lifestyle, including: Alcohol, nicotine or tobacco, and drug use. Access to firearms. Diet, exercise, and sleep habits. Work and work environment. Sunscreen use. Safety issues such as seatbelt and bike helmet use. Physical exam Your health care provider will check your: Height and weight. These may be used to calculate your BMI (body mass index). BMI is a measurement that tells if you are at a healthy weight. Waist circumference. This measures the distance around your waistline. This measurement also tells if you are at a healthy weight and may help predict your risk of certain diseases, such as type 2 diabetes and high blood pressure. Heart rate and blood pressure. Body temperature. Skin for abnormal spots. What immunizations do I need?  Vaccines are usually given at various ages, according to a schedule. Your health care provider will recommend vaccines for you based on your age, medical history, and lifestyle or other factors, such as travel or where you work. What tests do I need? Screening Your health care provider may recommend screening tests for certain conditions. This may include: Lipid and cholesterol levels. Hepatitis C test. Hepatitis B test. HIV (human immunodeficiency virus) test. STI (sexually transmitted infection) testing, if you are at  risk. Lung cancer screening. Colorectal cancer screening. Diabetes screening. This is done by checking your blood sugar (glucose) after you have not eaten for a while (fasting). Mammogram. Talk with your health care provider about how often you should have regular mammograms. BRCA-related cancer screening. This may be done if you have a family history of breast, ovarian, tubal, or peritoneal cancers. Bone density scan. This is done to screen for osteoporosis. Talk with your health care provider about your test results, treatment options, and if necessary, the need for more tests. Follow these instructions at home: Eating and drinking  Eat a diet that includes fresh fruits and vegetables, whole grains, lean protein, and low-fat dairy products. Limit your intake of foods with high amounts of sugar, saturated fats, and salt. Take vitamin and mineral supplements as recommended by your health care provider. Do not drink alcohol if your health care provider tells you not to drink. If you drink alcohol: Limit how much you have to 0-1 drink a day. Know how much alcohol is in your drink. In the [place] one drink equals one 12 oz bottle of beer (355 mL), one 5 oz glass of wine (148 mL), or one 1 oz glass of hard liquor (44 mL). Lifestyle Brush your teeth every morning and night with fluoride toothpaste. Floss one time each day. Exercise for at least 30 minutes 5 or more days each week. Do not use any products that contain nicotine or tobacco. These products include cigarettes, chewing tobacco, and vaping devices, such as e-cigarettes. If you need help quitting, ask your health care provider. Do not use drugs. If you are sexually active, practice safe sex. Use a condom or other form of protection in order to prevent STIs. Take aspirin only as told by   your health care provider. Make sure that you understand how much to take and what form to take. Work with your health care provider to find out whether it  is safe and beneficial for you to take aspirin daily. Ask your health care provider if you need to take a cholesterol-lowering medicine (statin). Find healthy ways to manage stress, such as: Meditation, yoga, or listening to music. Journaling. Talking to a trusted person. Spending time with friends and family. Minimize exposure to UV radiation to reduce your risk of skin cancer. Safety Always wear your seat belt while driving or riding in a vehicle. Do not drive: If you have been drinking alcohol. Do not ride with someone who has been drinking. When you are tired or distracted. While texting. If you have been using any mind-altering substances or drugs. Wear a helmet and other protective equipment during sports activities. If you have firearms in your house, make sure you follow all gun safety procedures. What's next? Visit your health care provider once a year for an annual wellness visit. Ask your health care provider how often you should have your eyes and teeth checked. Stay up to date on all vaccines. This information is not intended to replace advice given to you by your health care provider. Make sure you discuss any questions you have with your health care provider. Document Revised: 12/05/2020 Document Reviewed: 12/05/2020 Elsevier Patient Education  2023 [employer] Inc[employer]  

## 2022-01-03 ENCOUNTER — Other Ambulatory Visit: Payer: Self-pay | Admitting: Physician Assistant

## 2022-01-07 ENCOUNTER — Other Ambulatory Visit: Payer: Medicare Other

## 2022-01-07 DIAGNOSIS — E785 Hyperlipidemia, unspecified: Secondary | ICD-10-CM

## 2022-01-07 DIAGNOSIS — Z0000 Encounter for general adult medical examination without abnormal findings: Secondary | ICD-10-CM | POA: Diagnosis not present

## 2022-01-07 DIAGNOSIS — R7303 Prediabetes: Secondary | ICD-10-CM

## 2022-01-08 LAB — COMPREHENSIVE METABOLIC PANEL
ALT: 18 IU/L (ref 0–32)
AST: 23 IU/L (ref 0–40)
Albumin/Globulin Ratio: 1.5 (ref 1.2–2.2)
Albumin: 4 g/dL (ref 3.8–4.8)
Alkaline Phosphatase: 89 IU/L (ref 44–121)
BUN/Creatinine Ratio: 24 (ref 12–28)
BUN: 16 mg/dL (ref 8–27)
Bilirubin Total: 0.3 mg/dL (ref 0.0–1.2)
CO2: 23 mmol/L (ref 20–29)
Calcium: 9.3 mg/dL (ref 8.7–10.3)
Chloride: 104 mmol/L (ref 96–106)
Creatinine, Ser: 0.66 mg/dL (ref 0.57–1.00)
Globulin, Total: 2.6 g/dL (ref 1.5–4.5)
Glucose: 103 mg/dL — ABNORMAL HIGH (ref 70–99)
Potassium: 4.3 mmol/L (ref 3.5–5.2)
Sodium: 140 mmol/L (ref 134–144)
Total Protein: 6.6 g/dL (ref 6.0–8.5)
eGFR: 93 mL/min/{1.73_m2} (ref >59)

## 2022-01-08 LAB — CBC
Hematocrit: 44.5 % (ref 34.0–46.6)
Hemoglobin: 14.8 g/dL (ref 11.1–15.9)
MCH: 29.8 pg (ref 26.6–33.0)
MCHC: 33.3 g/dL (ref 31.5–35.7)
MCV: 90 fL (ref 79–97)
Platelets: 339 10*3/uL (ref 150–450)
RBC: 4.97 x10E6/uL (ref 3.77–5.28)
RDW: 13.4 % (ref 11.7–15.4)
WBC: 6.9 10*3/uL (ref 3.4–10.8)

## 2022-01-08 LAB — HEMOGLOBIN A1C
Est. average glucose Bld gHb Est-mCnc: 114 mg/dL
Hgb A1c MFr Bld: 5.6 % (ref 4.8–5.6)

## 2022-01-08 LAB — LIPID PANEL
Chol/HDL Ratio: 4.4 ratio (ref 0.0–4.4)
Cholesterol, Total: 237 mg/dL — ABNORMAL HIGH (ref 100–199)
HDL: 54 mg/dL (ref >39)
LDL Chol Calc (NIH): 172 mg/dL — ABNORMAL HIGH (ref 0–99)
Triglycerides: 66 mg/dL (ref 0–149)
VLDL Cholesterol Cal: 11 mg/dL (ref 5–40)

## 2022-01-08 LAB — TSH: TSH: 2.47 u[IU]/mL (ref 0.450–4.500)

## 2022-01-14 ENCOUNTER — Encounter: Payer: Self-pay | Admitting: Physician Assistant

## 2022-01-14 ENCOUNTER — Ambulatory Visit (INDEPENDENT_AMBULATORY_CARE_PROVIDER_SITE_OTHER): Payer: Medicare Other | Admitting: Physician Assistant

## 2022-01-14 VITALS — BP 110/66 | HR 76 | Ht 66.14 in | Wt 158.8 lb

## 2022-01-14 DIAGNOSIS — R7303 Prediabetes: Secondary | ICD-10-CM

## 2022-01-14 DIAGNOSIS — R002 Palpitations: Secondary | ICD-10-CM

## 2022-01-14 DIAGNOSIS — E785 Hyperlipidemia, unspecified: Secondary | ICD-10-CM | POA: Insufficient documentation

## 2022-01-14 DIAGNOSIS — R030 Elevated blood-pressure reading, without diagnosis of hypertension: Secondary | ICD-10-CM | POA: Diagnosis not present

## 2022-01-14 NOTE — Patient Instructions (Signed)
Heart-Healthy Eating Plan Heart-healthy meal planning includes: Eating less unhealthy fats. Eating more healthy fats. Making other changes in your diet. Talk with your doctor or a diet specialist (dietitian) to create an eating plan that is right for you. What is my plan? Your doctor may recommend an eating plan that includes: Total fat: ______% or less of total calories a day. Saturated fat: ______% or less of total calories a day. Cholesterol: less than _________mg a day. What are tips for following this plan? Cooking Avoid frying your food. Try to bake, boil, grill, or broil it instead. You can also reduce fat by: Removing the skin from poultry. Removing all visible fats from meats. Steaming vegetables in water or broth. Meal planning  At meals, divide your plate into four equal parts: Fill one-half of your plate with vegetables and green salads. Fill one-fourth of your plate with whole grains. Fill one-fourth of your plate with lean protein foods. Eat 4-5 servings of vegetables per day. A serving of vegetables is: 1 cup of raw or cooked vegetables. 2 cups of raw leafy greens. Eat 4-5 servings of fruit per day. A serving of fruit is: 1 medium whole fruit.  cup of dried fruit.  cup of fresh, frozen, or canned fruit.  cup of 100% fruit juice. Eat more foods that have soluble fiber. These are apples, broccoli, carrots, beans, peas, and barley. Try to get 20-30 g of fiber per day. Eat 4-5 servings of nuts, legumes, and seeds per week: 1 serving of dried beans or legumes equals  cup after being cooked. 1 serving of nuts is  cup. 1 serving of seeds equals 1 tablespoon. General information Eat more home-cooked food. Eat less restaurant, buffet, and fast food. Limit or avoid alcohol. Limit foods that are high in starch and sugar. Avoid fried foods. Lose weight if you are overweight. Keep track of how much salt (sodium) you eat. This is important if you have high blood  pressure. Ask your doctor to tell you more about this. Try to add vegetarian meals each week. Fats Choose healthy fats. These include olive oil and canola oil, flaxseeds, walnuts, almonds, and seeds. Eat more omega-3 fats. These include salmon, mackerel, sardines, tuna, flaxseed oil, and ground flaxseeds. Try to eat fish at least 2 times each week. Check food labels. Avoid foods with trans fats or high amounts of saturated fat. Limit saturated fats. These are often found in animal products, such as meats, butter, and cream. These are also found in plant foods, such as palm oil, palm kernel oil, and coconut oil. Avoid foods with partially hydrogenated oils in them. These have trans fats. Examples are stick margarine, some tub margarines, cookies, crackers, and other baked goods. What foods can I eat? Fruits All fresh, canned (in natural juice), or frozen fruits. Vegetables Fresh or frozen vegetables (raw, steamed, roasted, or grilled). Green salads. Grains Most grains. Choose whole wheat and whole grains most of the time. Rice and pasta, including brown rice and pastas made with whole wheat. Meats and other proteins Lean, well-trimmed beef, veal, pork, and lamb. Chicken and turkey without skin. All fish and shellfish. Wild duck, rabbit, pheasant, and venison. Egg whites or low-cholesterol egg substitutes. Dried beans, peas, lentils, and tofu. Seeds and most nuts. Dairy Low-fat or nonfat cheeses, including ricotta and mozzarella. Skim or 1% milk that is liquid, powdered, or evaporated. Buttermilk that is made with low-fat milk. Nonfat or low-fat yogurt. Fats and oils Non-hydrogenated (trans-free) margarines. Vegetable oils, including   soybean, sesame, sunflower, olive, peanut, safflower, corn, canola, and cottonseed. Salad dressings or mayonnaise made with a vegetable oil. Beverages Mineral water. Coffee and tea. Diet carbonated beverages. Sweets and desserts Sherbet, gelatin, and fruit ice.  Small amounts of dark chocolate. Limit all sweets and desserts. Seasonings and condiments All seasonings and condiments. The items listed above may not be a complete list of foods and drinks you can eat. Contact a dietitian for more options. What foods should I avoid? Fruits Canned fruit in heavy syrup. Fruit in cream or butter sauce. Fried fruit. Limit coconut. Vegetables Vegetables cooked in cheese, cream, or butter sauce. Fried vegetables. Grains Breads that are made with saturated or trans fats, oils, or whole milk. Croissants. Sweet rolls. Donuts. High-fat crackers, such as cheese crackers. Meats and other proteins Fatty meats, such as hot dogs, ribs, sausage, bacon, rib-eye roast or steak. High-fat deli meats, such as salami and bologna. Caviar. Domestic duck and goose. Organ meats, such as liver. Dairy Cream, sour cream, cream cheese, and creamed cottage cheese. Whole-milk cheeses. Whole or 2% milk that is liquid, evaporated, or condensed. Whole buttermilk. Cream sauce or high-fat cheese sauce. Yogurt that is made from whole milk. Fats and oils Meat fat, or shortening. Cocoa butter, hydrogenated oils, palm oil, coconut oil, palm kernel oil. Solid fats and shortenings, including bacon fat, salt pork, lard, and butter. Nondairy cream substitutes. Salad dressings with cheese or sour cream. Beverages Regular sodas and juice drinks with added sugar. Sweets and desserts Frosting. Pudding. Cookies. Cakes. Pies. Milk chocolate or white chocolate. Buttered syrups. Full-fat ice cream or ice cream drinks. The items listed above may not be a complete list of foods and drinks to avoid. Contact a dietitian for more information. Summary Heart-healthy meal planning includes eating less unhealthy fats, eating more healthy fats, and making other changes in your diet. Eat a balanced diet. This includes fruits and vegetables, low-fat or nonfat dairy, lean protein, nuts and legumes, whole grains, and  heart-healthy oils and fats. This information is not intended to replace advice given to you by your health care provider. Make sure you discuss any questions you have with your health care provider. Document Revised: 10/18/2020 Document Reviewed: 10/18/2020 Elsevier Patient Education  2022 [employer] Inc.  

## 2022-01-14 NOTE — Progress Notes (Signed)
Established patient visit   Patient: Kerry Anderson   DOB: April 01, 1951   71 y.o. Female  MRN: [id number] Visit Date: 01/14/2022  Chief Complaint  Patient presents with   Follow-up   Subjective    HPI  Patient presents for follow-up on elevated blood pressure reading. Patient has been checking blood pressure at home with BP readings ranging 119-158/60-84. Average readings 130s/70s. Patient does report high sodium intake. Patient does report sometimes feeling lightheaded. No chest pain, headache or pre-syncope. Does report a syncope episode 5 years ago which has not happened again. States since her Covid infection has experienced episodic palpitations which last briefly. Patient reports has been eating a lot of tomato sandwiches with mayo and salt.       Medications: No outpatient medications prior to visit.   No facility-administered medications prior to visit.    Review of Systems Review of Systems:  A fourteen system review of systems was performed and found to be positive as per HPI.  Last CBC Lab Results  Component Value Date   WBC 6.9 01/07/2022   HGB 14.8 01/07/2022   HCT 44.5 01/07/2022   MCV 90 01/07/2022   MCH 29.8 01/07/2022   RDW 13.4 01/07/2022   PLT 339 31/51/7616   Last metabolic panel Lab Results  Component Value Date   GLUCOSE 103 (H) 01/07/2022   NA 140 01/07/2022   K 4.3 01/07/2022   CL 104 01/07/2022   CO2 23 01/07/2022   BUN 16 01/07/2022   CREATININE 0.66 01/07/2022   EGFR 93 01/07/2022   CALCIUM 9.3 01/07/2022   PROT 6.6 01/07/2022   ALBUMIN 4.0 01/07/2022   LABGLOB 2.6 01/07/2022   AGRATIO 1.5 01/07/2022   BILITOT 0.3 01/07/2022   ALKPHOS 89 01/07/2022   AST 23 01/07/2022   ALT 18 01/07/2022   Last lipids Lab Results  Component Value Date   CHOL 237 (H) 01/07/2022   HDL 54 01/07/2022   LDLCALC 172 (H) 01/07/2022   TRIG 66 01/07/2022   CHOLHDL 4.4 01/07/2022   Last hemoglobin A1c Lab Results  Component Value Date   HGBA1C 5.6  01/07/2022   Last thyroid functions Lab Results  Component Value Date   TSH 2.470 01/07/2022   Last vitamin D No results found for: "25OHVITD2", "25OHVITD3", "VD25OH"     Objective    BP 110/66   Pulse 76   Ht 5' 6.14" (1.68 m)   Wt 158 lb 12.8 oz (72 kg)   SpO2 97%   BMI 25.52 kg/m  BP Readings from Last 3 Encounters:  01/14/22 110/66  12/31/21 (!) 148/72  03/01/20 126/74   Wt Readings from Last 3 Encounters:  01/14/22 158 lb 12.8 oz (72 kg)  12/31/21 159 lb (72.1 kg)  02/05/21 162 lb (73.5 kg)    Physical Exam  General:  Pleasant and cooperative, appropriate for stated age.  Neuro:  Alert and oriented,  extra-ocular muscles intact  HEENT:  Normocephalic, atraumatic, neck supple  Skin:  no gross rash, warm, pink. Cardiac:  RRR, S1 S2 Respiratory: CTA B/L  Vascular:  Ext warm, no cyanosis apprec.; cap RF less 2 sec. Psych:  No HI/SI, judgement and insight good, Euthymic mood. Full Affect.   No results found for any visits on 01/14/22.  Assessment & Plan      Problem List Items Addressed This Visit       Other   Hyperlipidemia - Primary    -Discussed with patient recent lipid panel, LDL increased from  prior and elevated. Patient prefers to work on diet and lifestyle changes before considering statin therapy. Will repeat lipid panel in 6 months. Discussed the American Heart Association has good resources. Will continue to monitor. -The ASCVD Risk score (Arnett DK, et al., 2019) failed to calculate for the following reasons:   Unable to determine if patient is Non-Hispanic African American       Other Visit Diagnoses     Prediabetes       Palpitations       Elevated blood pressure reading          Elevated blood pressure reading: -Reviewed ambulatory BP readings and BP today is normal at 110/66. Discussed with patient sodium intake likely contributing to elevated blood pressure readings. Patient will work on reducing sodium intake and continue with  monitoring BP/pulse at home. BP goal <140/90. Will continue to monitor. Discussed recent CMP, renal function and electrolytes normal.   Prediabetes: -Discussed with patient recent A1c which has improved from 5.7 to 5.6. Discussed low carbohydrate and glucose diet. Will continue to monitor.  Palpitations: -Discussed with patient various etiologies. Recent CBC, TSH and CMP normal. Patient prefers to monitor symptoms and if they worsen or change then will let me know if decides to pursue Holter Monitor. Also recommend adequate hydration.   Return in about 6 months (around 07/17/2022) for HLD, elevated BP and FBW (lipid panel, cmp).        Lorrene Reid, PA-C  [facility] [phone] (phone) [phone] (fax)  [place]

## 2022-01-14 NOTE — Assessment & Plan Note (Signed)
-  Discussed with patient recent lipid panel, LDL increased from prior and elevated. Patient prefers to work on diet and lifestyle changes before considering statin therapy. Will repeat lipid panel in 6 months. Discussed the American Heart Association has good resources. Will continue to monitor. -The ASCVD Risk score (Arnett DK, et al., 2019) failed to calculate for the following reasons:   Unable to determine if patient is Non-Hispanic African American

## 2023-07-04 IMAGING — MG MM DIGITAL SCREENING BILAT W/ TOMO AND CAD
8 series · 9 of 24 positions shown · non-contrast
Comparison: None available.

CLINICAL DATA: Screening.

EXAM:
DIGITAL SCREENING BILATERAL MAMMOGRAM WITH TOMOSYNTHESIS AND CAD
TECHNIQUE: Bilateral screening digital craniocaudal and mediolateral oblique
mammograms were obtained. Bilateral screening digital breast
tomosynthesis was performed. The images were evaluated with
computer-aided detection.

[R MLO synth-2D]
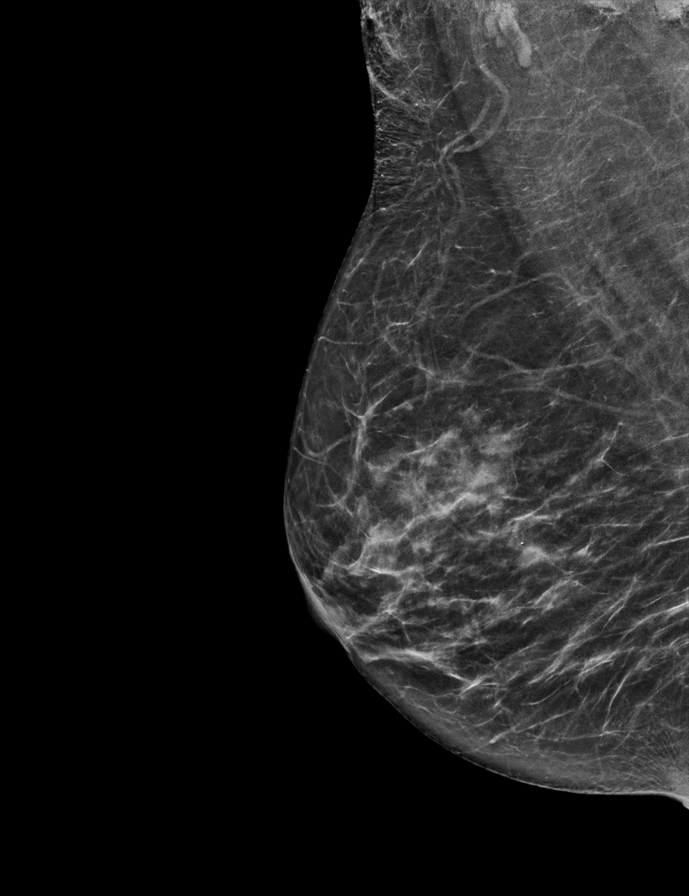

[L CC synth-2D]
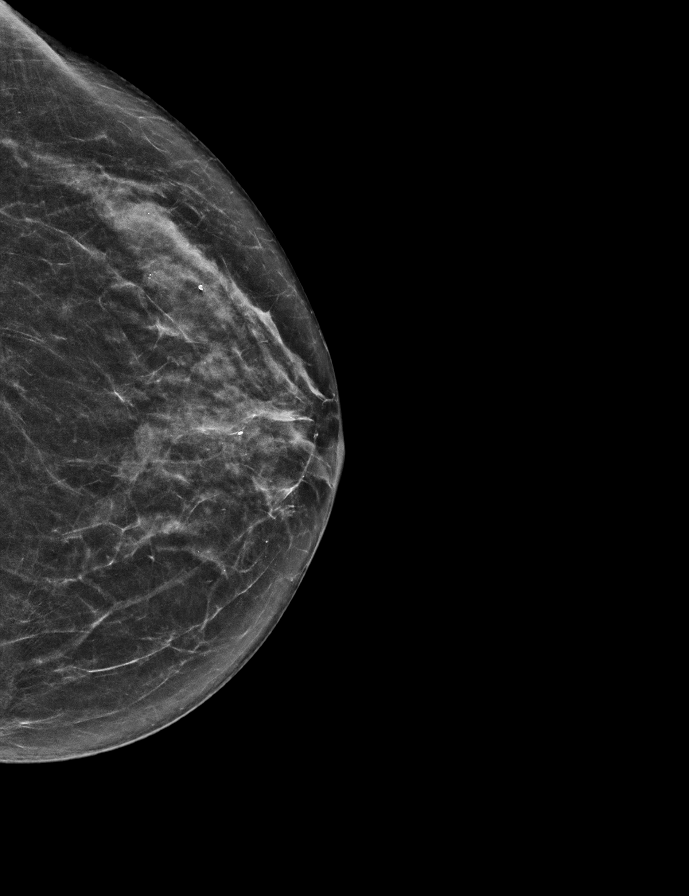

[R CC synth-2D]
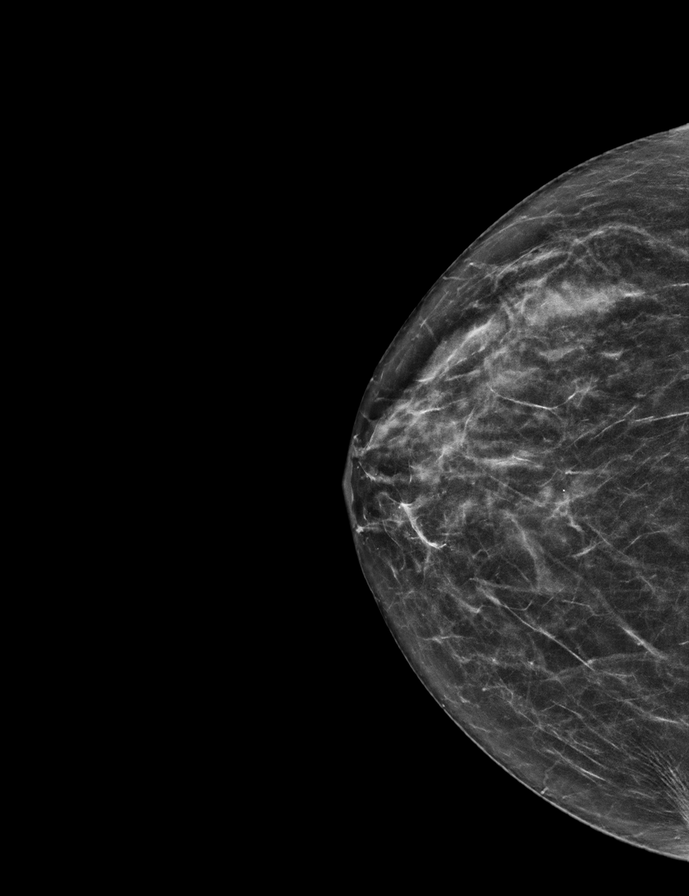

[L MLO synth-2D]
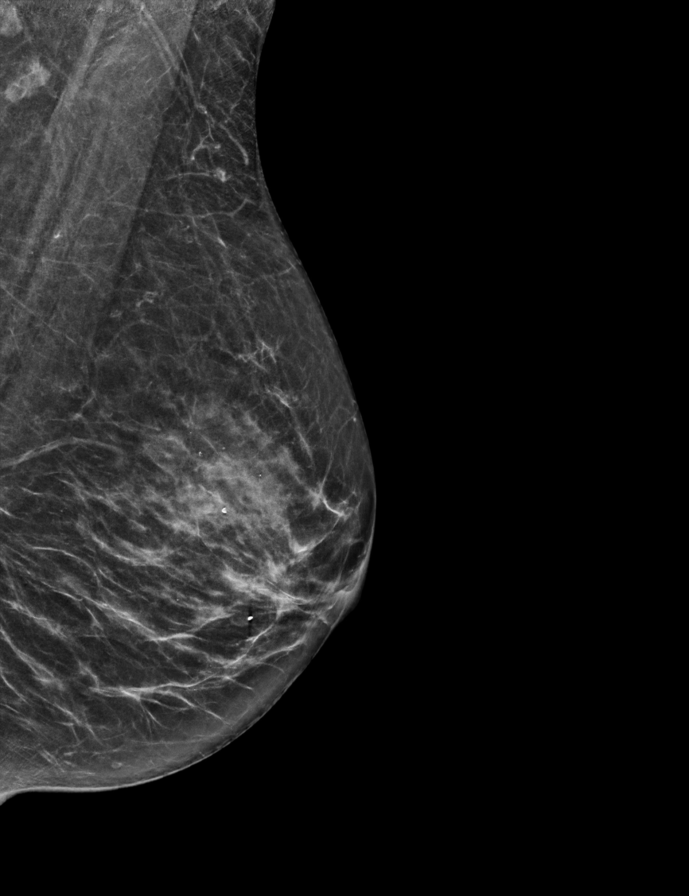

[L MLO tomo · 2 of 58 frames shown]
[frame 19/58]
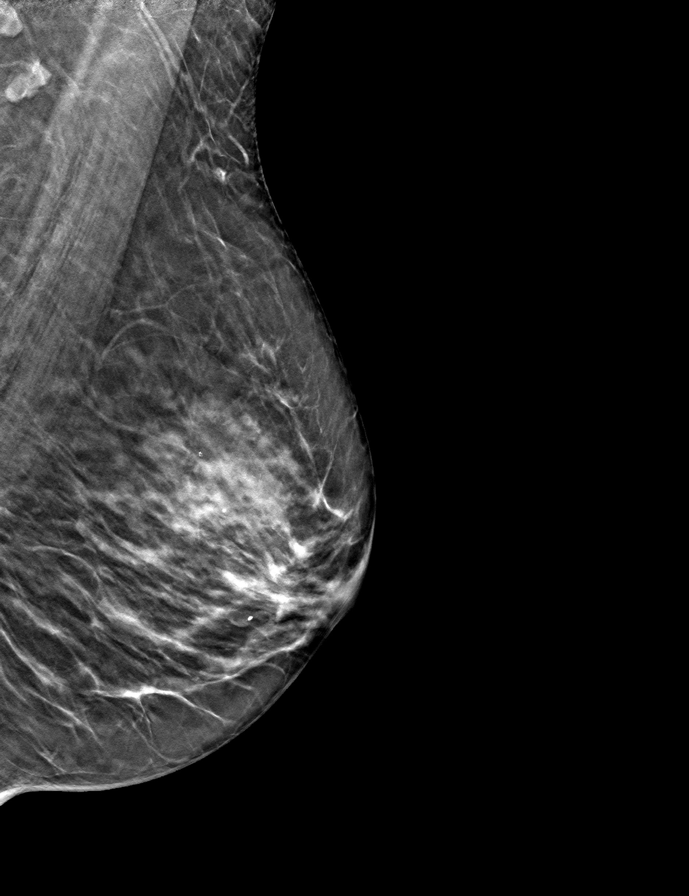
[frame 29/58]
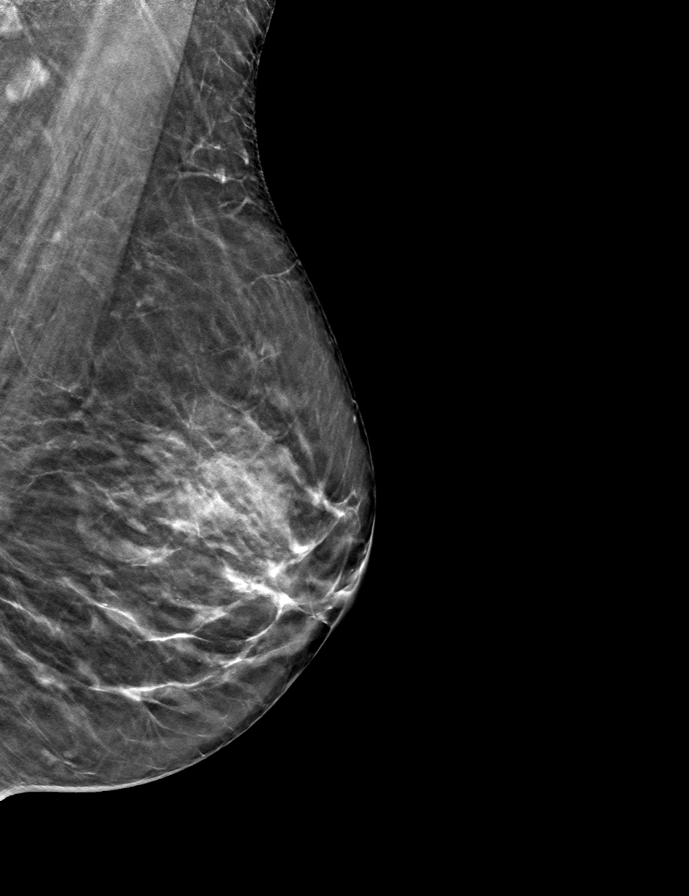

[R CC tomo · tomo slice 30/59.0]
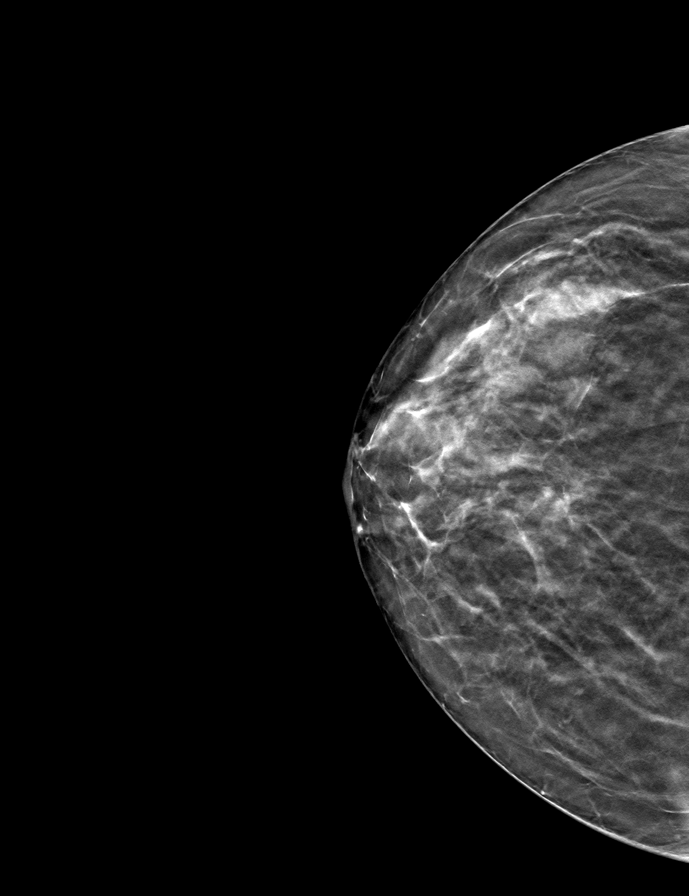

[R MLO tomo · tomo slice 31/61.0]
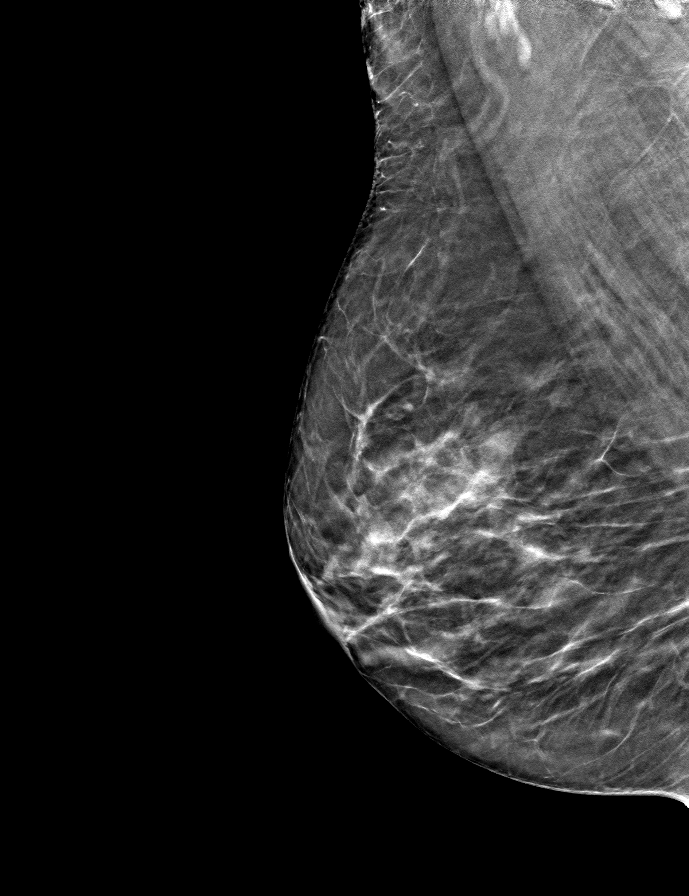

[L CC tomo · tomo slice 29/57.0]
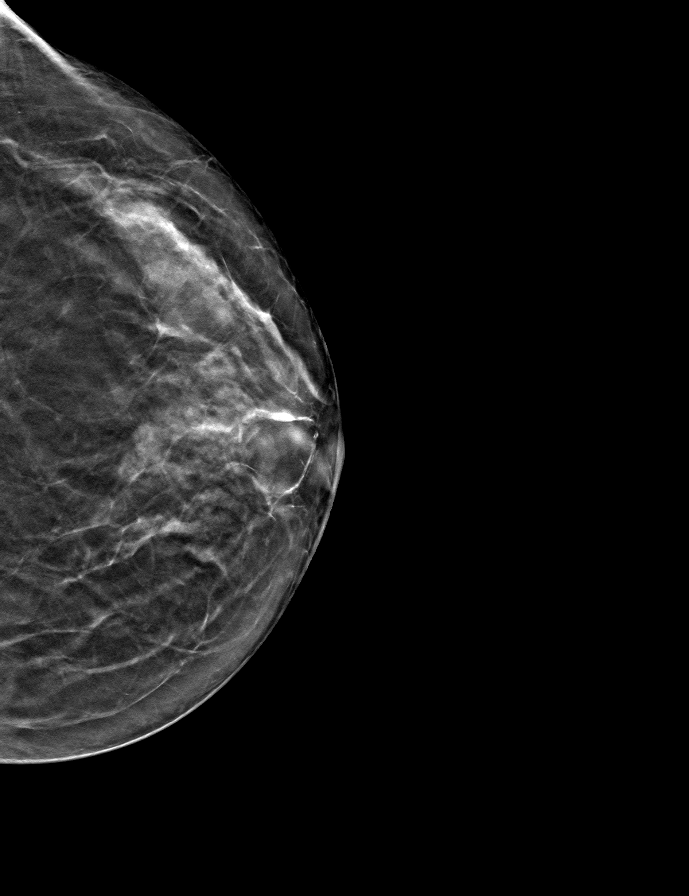

[9 of 24 positions shown; findings below may reference images not displayed]

ACR Breast Density Category c: The breast tissue is heterogeneously
dense, which may obscure small masses.
FINDINGS: There are no findings suspicious for malignancy.
IMPRESSION: No mammographic evidence of malignancy. A result letter of this
screening mammogram will be mailed directly to the patient.

RECOMMENDATION:
Screening mammogram in one year. (Code:RO-G-MJI)

BI-RADS CATEGORY  1: Negative.
